# Patient Record
Sex: Male | Born: 1990 | Race: Black or African American | Hispanic: No | Marital: Single | Smoking: Never smoker
Health system: Southern US, Community
[De-identification: ages and names within clinical notes are randomized; demographics above are authoritative.]

## PROBLEM LIST (undated history)

## (undated) HISTORY — PX: TONSILLECTOMY: SUR1361

---

## 2020-03-17 ENCOUNTER — Ambulatory Visit (HOSPITAL_COMMUNITY)
Admission: EM | Admit: 2020-03-17 | Discharge: 2020-03-17 | Disposition: A | Discharge status: Home / Self Care | Payer: 59 | Attending: Emergency Medicine | Admitting: Emergency Medicine

## 2020-03-17 ENCOUNTER — Encounter (HOSPITAL_COMMUNITY): Payer: Self-pay | Admitting: Emergency Medicine

## 2020-03-17 ENCOUNTER — Other Ambulatory Visit: Payer: Self-pay

## 2020-03-17 DIAGNOSIS — G8929 Other chronic pain: Secondary | ICD-10-CM

## 2020-03-17 DIAGNOSIS — M546 Pain in thoracic spine: Secondary | ICD-10-CM | POA: Diagnosis not present

## 2020-03-17 MED ORDER — METHOCARBAMOL 500 MG PO TABS: 500.0000 mg | ORAL_TABLET | Freq: Every evening | ORAL | 0 refills | Status: AC | PRN

## 2020-03-17 MED ORDER — IBUPROFEN 800 MG PO TABS: 800.0000 mg | ORAL_TABLET | Freq: Three times a day (TID) | ORAL | 0 refills | Status: AC | PRN

## 2020-03-17 NOTE — ED Triage Notes (Signed)
Pt presents with low back pain xs 2 years. States recently started a job that requires a lot of lifting and would like to be evaluated to be able to work. States worse with sitting or standing for long periods of time and lifting heavy objects.

## 2020-03-17 NOTE — ED Provider Notes (Signed)
[facility]    CSN: [id number] Arrival date & time: 03/17/20  1605      History   Chief Complaint Chief Complaint  Patient presents with  . Back Pain    HPI Jeff Odom is a 30 y.o. male.   Patient presents with intermittent mid back pain x2 years.  He states he recently started a job that requires prolonged standing and has caused his back pain to get worse.  He also reports the pain is worse with lifting heavy objects.  He denies numbness, weakness, paresthesias, loss of bowel/bladder control, saddle anesthesia, dysuria, fever, chills, chest pain, shortness of breath, abdominal pain, or other symptoms.  No medications taken at home for his symptoms.  He denies pertinent medical history.  The history is provided by the patient and medical records.    History reviewed. No pertinent past medical history.  There are no problems to display for this patient.   Past Surgical History:  Procedure Laterality Date  . TONSILLECTOMY         Home Medications    Prior to Admission medications   Medication Sig Start Date End Date Taking? Authorizing Provider  ibuprofen (ADVIL) 800 MG tablet Take 1 tablet (800 mg total) by mouth every 8 (eight) hours as needed. 03/17/20  Yes Mickie Bail, NP  methocarbamol (ROBAXIN) 500 MG tablet Take 1 tablet (500 mg total) by mouth at bedtime as needed for muscle spasms. 03/17/20  Yes Mickie Bail, NP    Family History History reviewed. No pertinent family history.  Social History Social History   Tobacco Use  . Smoking status: Never Smoker  . Smokeless tobacco: Never Used  Substance Use Topics  . Alcohol use: Never     Allergies   Patient has no known allergies.   Review of Systems Review of Systems  Constitutional: Negative for chills and fever.  HENT: Negative for ear pain and sore throat.   Eyes: Negative for pain and visual disturbance.  Respiratory: Negative for cough and shortness of breath.   Cardiovascular:  Negative for chest pain and palpitations.  Gastrointestinal: Negative for abdominal pain and vomiting.  Genitourinary: Negative for dysuria and hematuria.  Musculoskeletal: Positive for back pain. Negative for arthralgias.  Skin: Negative for color change and rash.  Neurological: Negative for syncope, weakness and numbness.  All other systems reviewed and are negative.    Physical Exam Triage Vital Signs ED Triage Vitals  Enc Vitals Group     BP 03/17/20 1642 128/84     Pulse Rate 03/17/20 1642 68     Resp 03/17/20 1642 16     Temp 03/17/20 1642 98.9 F (37.2 C)     Temp Source 03/17/20 1642 Oral     SpO2 03/17/20 1642 100 %     Weight --      Height --      Head Circumference --      Peak Flow --      Pain Score 03/17/20 1639 5     Pain Loc --      Pain Edu? --      Excl. in GC? --    No data found.  Updated Vital Signs BP 128/84 (BP Location: Right Arm)   Pulse 68   Temp 98.9 F (37.2 C) (Oral)   Resp 16   SpO2 100%   Visual Acuity Right Eye Distance:   Left Eye Distance:   Bilateral Distance:    Right Eye Near:  Left Eye Near:    Bilateral Near:     Physical Exam Vitals and nursing note reviewed.  Constitutional:      General: He is not in acute distress.    Appearance: He is well-developed and well-nourished. He is not ill-appearing.  HENT:     Head: Normocephalic and atraumatic.     Mouth/Throat:     Mouth: Mucous membranes are moist.  Eyes:     Conjunctiva/sclera: Conjunctivae normal.  Cardiovascular:     Rate and Rhythm: Normal rate and regular rhythm.     Heart sounds: Normal heart sounds.  Pulmonary:     Effort: Pulmonary effort is normal. No respiratory distress.     Breath sounds: Normal breath sounds.  Abdominal:     Palpations: Abdomen is soft.     Tenderness: There is no abdominal tenderness. There is no guarding or rebound.  Musculoskeletal:        General: No swelling, tenderness, deformity, signs of injury or edema. Normal range  of motion.     Cervical back: Neck supple.  Skin:    General: Skin is warm and dry.     Findings: No bruising, erythema, lesion or rash.  Neurological:     General: No focal deficit present.     Mental Status: He is alert and oriented to person, place, and time.     Sensory: No sensory deficit.     Motor: No weakness.     Coordination: Coordination normal.     Gait: Gait normal.     Comments: Straight leg raise negative.  Psychiatric:        Mood and Affect: Mood and affect and mood normal.        Behavior: Behavior normal.      UC Treatments / Results  Labs (all labs ordered are listed, but only abnormal results are displayed) Labs Reviewed - No data to display  EKG   Radiology No results found.  Procedures Procedures (including critical care time)  Medications Ordered in UC Medications - No data to display  Initial Impression / Assessment and Plan / UC Course  I have reviewed the triage vital signs and the nursing notes.  Pertinent labs & imaging results that were available during my care of the patient were reviewed by me and considered in my medical decision making (see chart for details).   Chronic back pain.  Treating with ibuprofen and Robaxin.  Precautions for drowsiness with Robaxin discussed.  Instructed patient to follow-up with his PCP or an orthopedist if his symptoms are not improving.  He agrees to plan of care.   Final Clinical Impressions(s) / UC Diagnoses   Final diagnoses:  Chronic bilateral thoracic back pain     Discharge Instructions     Take ibuprofen as needed for discomfort.  Take the muscle relaxer as needed for muscle spasm; Do not drive, operate machinery, or drink alcohol with this medication as it can cause drowsiness.   Follow up with your primary care provider or an orthopedist if your symptoms are not improving.        ED Prescriptions    Medication Sig Dispense Auth. Provider   ibuprofen (ADVIL) 800 MG tablet Take 1  tablet (800 mg total) by mouth every 8 (eight) hours as needed. 21 tablet Mickie Bail, NP   methocarbamol (ROBAXIN) 500 MG tablet Take 1 tablet (500 mg total) by mouth at bedtime as needed for muscle spasms. 10 tablet Mickie Bail, NP  I have reviewed the PDMP during this encounter.   Mickie Bail, NP 03/17/20 [phone]

## 2020-03-17 NOTE — Discharge Instructions (Signed)
Take ibuprofen as needed for discomfort.  Take the muscle relaxer as needed for muscle spasm; Do not drive, operate machinery, or drink alcohol with this medication as it can cause drowsiness.   Follow up with your primary care provider or an orthopedist if your symptoms are not improving.     

## 2020-06-03 ENCOUNTER — Encounter (HOSPITAL_COMMUNITY): Payer: Self-pay

## 2020-06-03 ENCOUNTER — Ambulatory Visit (INDEPENDENT_AMBULATORY_CARE_PROVIDER_SITE_OTHER): Payer: 59

## 2020-06-03 ENCOUNTER — Ambulatory Visit: Admission: EM | Admit: 2020-06-03 | Discharge status: Absent without leave | Payer: 59

## 2020-06-03 ENCOUNTER — Ambulatory Visit (HOSPITAL_COMMUNITY)
Admission: EM | Admit: 2020-06-03 | Discharge: 2020-06-03 | Disposition: A | Discharge status: Home / Self Care | Payer: 59 | Attending: Emergency Medicine | Admitting: Emergency Medicine

## 2020-06-03 ENCOUNTER — Other Ambulatory Visit: Payer: Self-pay

## 2020-06-03 DIAGNOSIS — R059 Cough, unspecified: Secondary | ICD-10-CM | POA: Diagnosis not present

## 2020-06-03 MED ORDER — BENZONATATE 100 MG PO CAPS: 100.0000 mg | ORAL_CAPSULE | Freq: Three times a day (TID) | ORAL | 0 refills | Status: AC | PRN

## 2020-06-03 MED ORDER — PREDNISONE 20 MG PO TABS: 40.0000 mg | ORAL_TABLET | Freq: Every day | ORAL | 0 refills | Status: AC

## 2020-06-03 NOTE — Discharge Instructions (Addendum)
Your chest xray looks well tonight which is reassuring.  Complete course of prednisone over the next 5 days.  Use of tessalon and/or Mucinex D (over the counter ) to also help with cough.  Please follow up with a primary care provider to establish care and for follow up as needed if your symptoms persist.

## 2020-06-03 NOTE — ED Provider Notes (Signed)
[facility]    CSN: [id number] Arrival date & time: 06/03/20  1816      History   Chief Complaint Chief Complaint  Patient presents with  . Cough  . Shortness of Breath    HPI Jeff Odom is a 30 y.o. male.   Jeff Odom presents with complaints of cough for the past two months. Worse at night. Productive of sputum. Feels shortness of breath at night. No known fevers. No sore throat. No ear pain. No history of asthma or copd. Doesn't smoke. No gi symptoms. No cardiac history. States it started out as a "flu" two months ago. Has used natural treatments such as honey which haven't been helping. No extremity edema. Minimal congestion or nasal drainage.     ROS per HPI, negative if not otherwise mentioned.      History reviewed. No pertinent past medical history.  There are no problems to display for this patient.   Past Surgical History:  Procedure Laterality Date  . TONSILLECTOMY         Home Medications    Prior to Admission medications   Medication Sig Start Date End Date Taking? Authorizing Provider  benzonatate (TESSALON) 100 MG capsule Take 1-2 capsules (100-200 mg total) by mouth 3 (three) times daily as needed for cough. 06/03/20  Yes Yeira Gulden, Dorene Grebe B, NP  predniSONE (DELTASONE) 20 MG tablet Take 2 tablets (40 mg total) by mouth daily with breakfast for 5 days. 06/03/20 06/08/20 Yes Starasia Sinko, Dorene Grebe B, NP  ibuprofen (ADVIL) 800 MG tablet Take 1 tablet (800 mg total) by mouth every 8 (eight) hours as needed. 03/17/20   Mickie Bail, NP  methocarbamol (ROBAXIN) 500 MG tablet Take 1 tablet (500 mg total) by mouth at bedtime as needed for muscle spasms. 03/17/20   Mickie Bail, NP    Family History History reviewed. No pertinent family history.  Social History Social History   Tobacco Use  . Smoking status: Never Smoker  . Smokeless tobacco: Never Used  Substance Use Topics  . Alcohol use: Never     Allergies   Patient has no known  allergies.   Review of Systems Review of Systems   Physical Exam Triage Vital Signs ED Triage Vitals  Enc Vitals Group     BP 06/03/20 1842 111/73     Pulse Rate 06/03/20 1842 77     Resp 06/03/20 1842 17     Temp 06/03/20 1842 98.8 F (37.1 C)     Temp Source 06/03/20 1842 Oral     SpO2 06/03/20 1842 97 %     Weight --      Height --      Head Circumference --      Peak Flow --      Pain Score 06/03/20 1840 0     Pain Loc --      Pain Edu? --      Excl. in GC? --    No data found.  Updated Vital Signs BP 111/73 (BP Location: Right Arm)   Pulse 77   Temp 98.8 F (37.1 C) (Oral)   Resp 17   SpO2 97%   Visual Acuity Right Eye Distance:   Left Eye Distance:   Bilateral Distance:    Right Eye Near:   Left Eye Near:    Bilateral Near:     Physical Exam Constitutional:      Appearance: He is well-developed.  Cardiovascular:     Rate and Rhythm: Normal  rate.  Pulmonary:     Effort: Pulmonary effort is normal.     Breath sounds: Normal breath sounds.  Skin:    General: Skin is warm and dry.  Neurological:     Mental Status: He is alert and oriented to person, place, and time.      UC Treatments / Results  Labs (all labs ordered are listed, but only abnormal results are displayed) Labs Reviewed - No data to display  EKG   Radiology DG Chest 2 View  Result Date: 06/03/2020 CLINICAL DATA:  Cough. EXAM: CHEST - 2 VIEW COMPARISON:  None. FINDINGS: The heart size and mediastinal contours are within normal limits. Both lungs are clear. No visible pleural effusions or pneumothorax. No acute osseous abnormality. IMPRESSION: No active cardiopulmonary disease. Electronically Signed   By: Feliberto Harts MD   On: 06/03/2020 19:41    Procedures Procedures (including critical care time)  Medications Ordered in UC Medications - No data to display  Initial Impression / Assessment and Plan / UC Course  I have reviewed the triage vital signs and the nursing  notes.  Pertinent labs & imaging results that were available during my care of the patient were reviewed by me and considered in my medical decision making (see chart for details).     2 months of cough. No acute findings on exam and chest xray without acute findings. Sounds likely post viral. Prednisone and tessalon provided. Follow up and return precautions provided. Patient verbalized understanding and agreeable to plan.   Final Clinical Impressions(s) / UC Diagnoses   Final diagnoses:  Cough     Discharge Instructions     Your chest xray looks well tonight which is reassuring.  Complete course of prednisone over the next 5 days.  Use of tessalon and/or Mucinex D (over the counter ) to also help with cough.  Please follow up with a primary care provider to establish care and for follow up as needed if your symptoms persist.     ED Prescriptions    Medication Sig Dispense Auth. Provider   predniSONE (DELTASONE) 20 MG tablet Take 2 tablets (40 mg total) by mouth daily with breakfast for 5 days. 10 tablet Linus Mako B, NP   benzonatate (TESSALON) 100 MG capsule Take 1-2 capsules (100-200 mg total) by mouth 3 (three) times daily as needed for cough. 21 capsule Georgetta Haber, NP     PDMP not reviewed this encounter.   Georgetta Haber, NP 06/03/20 2042

## 2020-06-03 NOTE — ED Triage Notes (Signed)
Pt c/o cough x 2 months. Pt denies fever, chest pain and states he sometimes feels SOB at night.

## 2022-10-11 IMAGING — DX DG CHEST 2V
2 series · 2 of 2 positions shown · non-contrast
Comparison: None.

CLINICAL DATA: Cough.

EXAM:
CHEST - 2 VIEW

[chest pa]
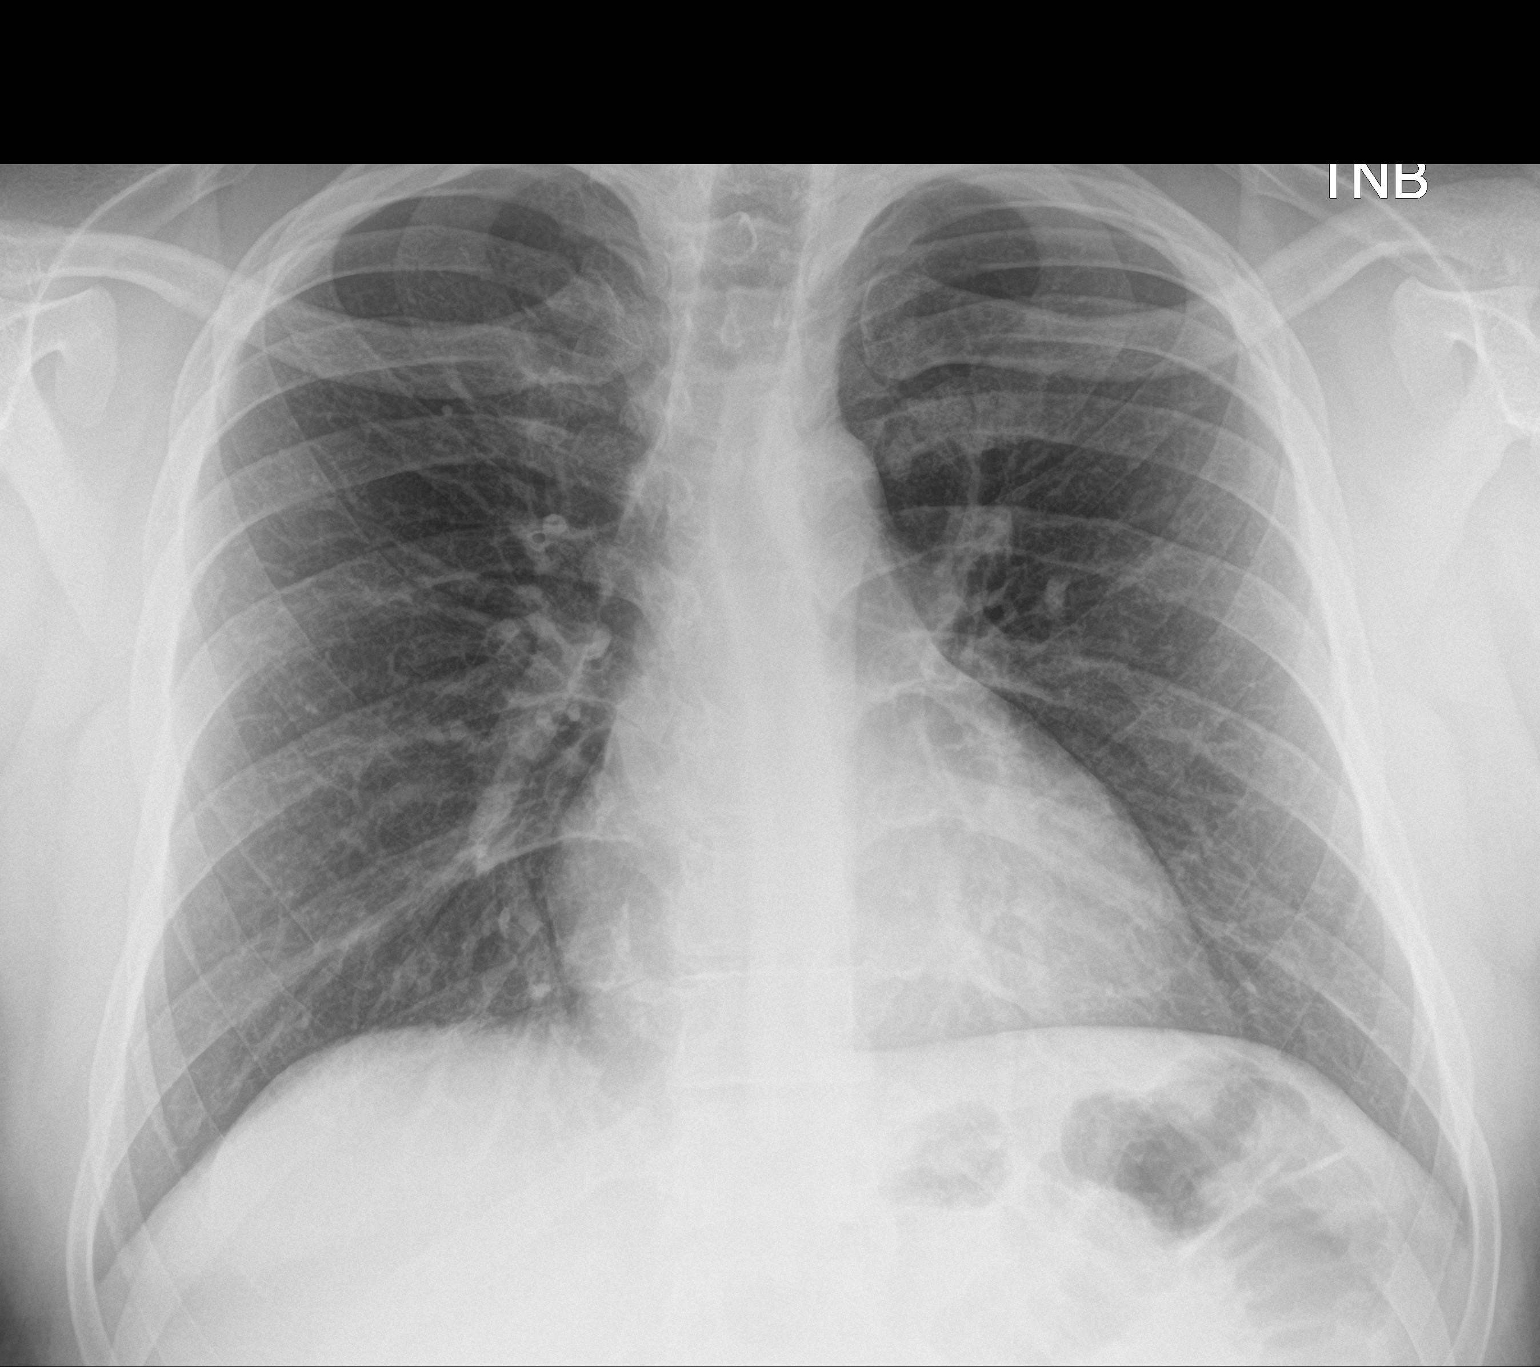

[chest lat]
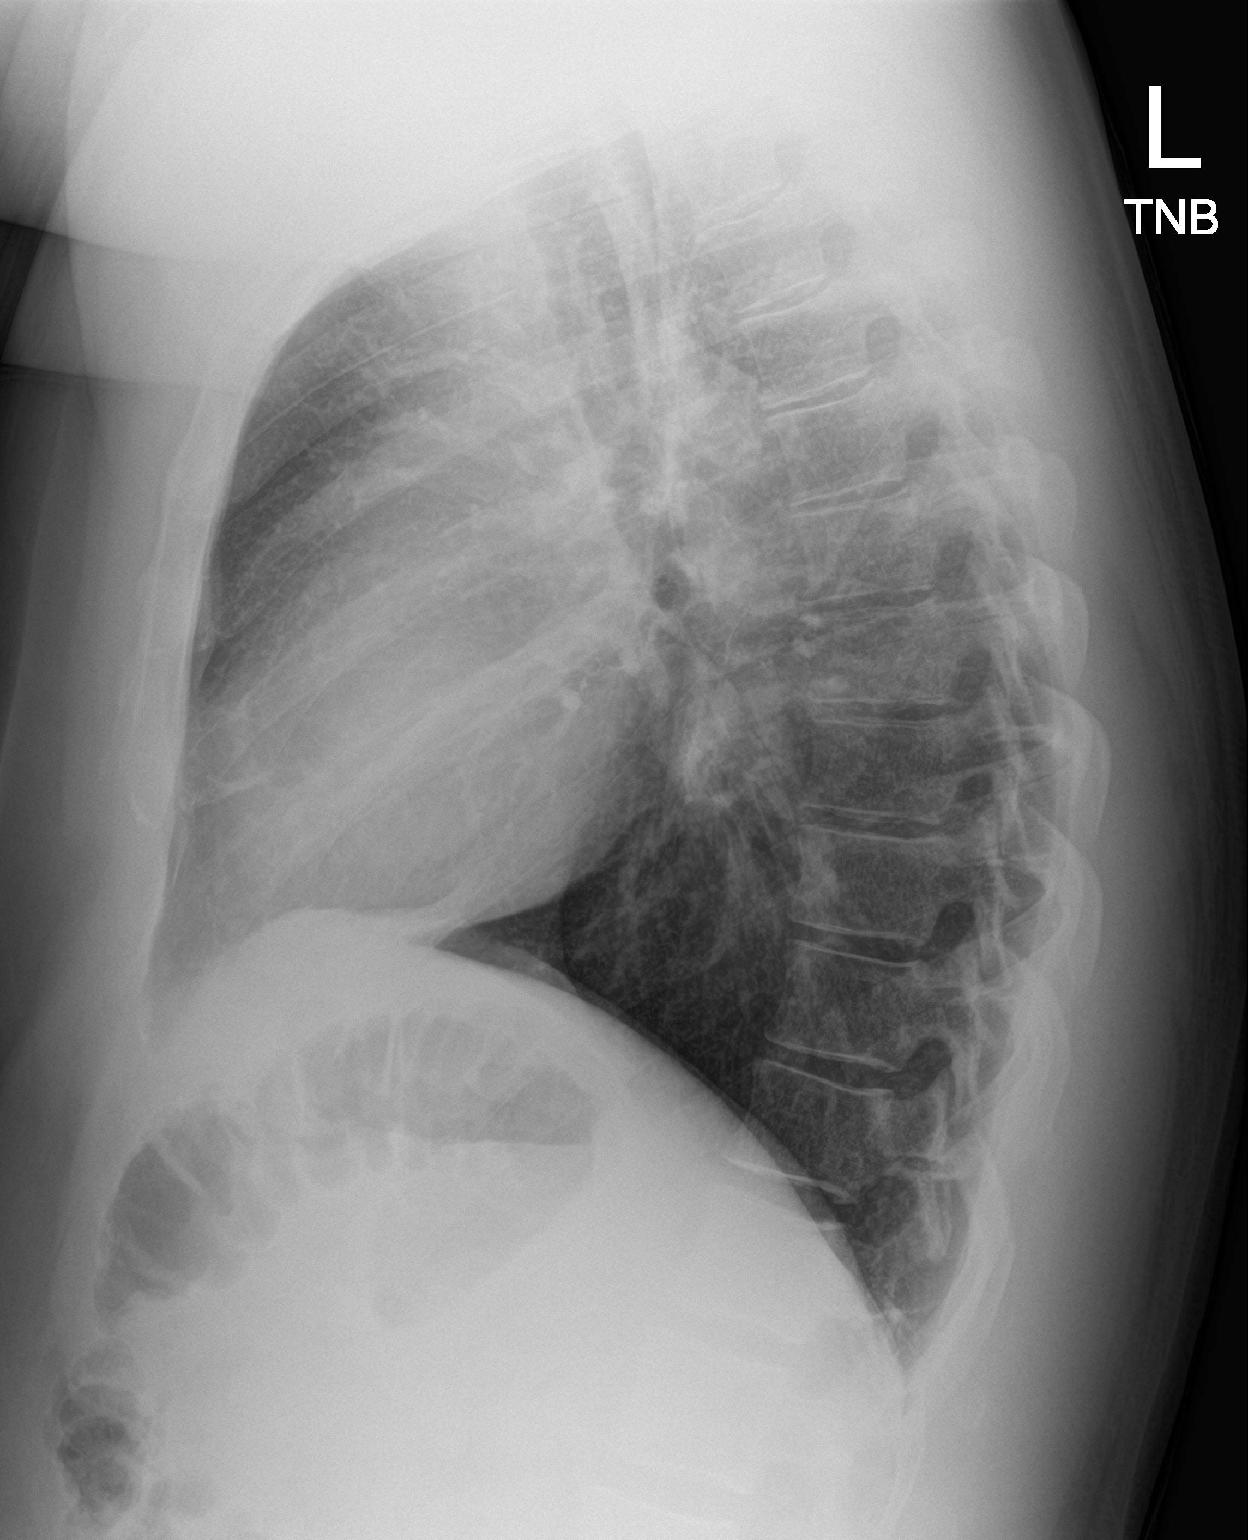

[2 of 2 positions shown; findings below may reference images not displayed]

FINDINGS: The heart size and mediastinal contours are within normal limits.
Both lungs are clear. No visible pleural effusions or pneumothorax.
No acute osseous abnormality.
IMPRESSION: No active cardiopulmonary disease.
